# Patient Record
Sex: Male | Born: 1995 | Race: White | Hispanic: No | Marital: Single | Smoking: Former smoker
Health system: Southern US, Community
[De-identification: ages and names within clinical notes are randomized; demographics above are authoritative.]

## PROBLEM LIST (undated history)

## (undated) DIAGNOSIS — R569 Unspecified convulsions: Secondary | ICD-10-CM

## (undated) DIAGNOSIS — F909 Attention-deficit hyperactivity disorder, unspecified type: Secondary | ICD-10-CM

---
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
## 2013-02-06 ENCOUNTER — Emergency Department (INDEPENDENT_AMBULATORY_CARE_PROVIDER_SITE_OTHER)
Admission: EM | Admit: 2013-02-06 | Discharge: 2013-02-06 | Disposition: A | Discharge status: Home / Self Care | Payer: Self-pay | Source: Home / Self Care | Attending: Family Medicine | Admitting: Family Medicine

## 2013-02-06 ENCOUNTER — Encounter (HOSPITAL_COMMUNITY): Payer: Self-pay | Admitting: Emergency Medicine

## 2013-02-06 DIAGNOSIS — Z043 Encounter for examination and observation following other accident: Secondary | ICD-10-CM
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
## 2013-09-14 ENCOUNTER — Encounter (HOSPITAL_COMMUNITY): Payer: Self-pay | Admitting: *Deleted

## 2013-09-14 ENCOUNTER — Emergency Department (HOSPITAL_COMMUNITY)
Admission: EM | Admit: 2013-09-14 | Discharge: 2013-09-14 | Disposition: A | Discharge status: Home / Self Care | Payer: Medicaid Other | Attending: Emergency Medicine | Admitting: Emergency Medicine

## 2013-09-14 DIAGNOSIS — Z23 Encounter for immunization: Secondary | ICD-10-CM | POA: Insufficient documentation

## 2013-09-14 DIAGNOSIS — T7411XA Adult physical abuse, confirmed, initial encounter: Secondary | ICD-10-CM | POA: Insufficient documentation

## 2013-09-14 DIAGNOSIS — S0083XA Contusion of other part of head, initial encounter: Secondary | ICD-10-CM

## 2013-09-14 DIAGNOSIS — S0003XA Contusion of scalp, initial encounter: Secondary | ICD-10-CM | POA: Insufficient documentation

## 2013-09-14 DIAGNOSIS — Z79899 Other long term (current) drug therapy: Secondary | ICD-10-CM | POA: Insufficient documentation

## 2013-09-14 DIAGNOSIS — F909 Attention-deficit hyperactivity disorder, unspecified type: Secondary | ICD-10-CM | POA: Insufficient documentation

## 2013-09-14 DIAGNOSIS — G40909 Epilepsy, unspecified, not intractable, without status epilepticus: Secondary | ICD-10-CM | POA: Insufficient documentation

## 2013-09-14 MED ORDER — TETANUS-DIPHTH-ACELL PERTUSSIS 5-2.5-18.5 LF-MCG/0.5 IM SUSP
0.5000 mL | Freq: Once | INTRAMUSCULAR | Status: AC
  Administered 2013-09-14: 0.5 mL via INTRAMUSCULAR
  Filled 2013-09-14: qty 0.5
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
## 2013-09-17 NOTE — ED Provider Notes (Signed)
Medical screening examination/treatment/procedure(s) were performed by non-physician practitioner and as supervising physician I was immediately available for consultation/collaboration.  Jaxyn Mestas T Stefanee Mckell, MD 09/17/13 0710 

## 2013-11-01 ENCOUNTER — Emergency Department (HOSPITAL_COMMUNITY)
Admission: EM | Admit: 2013-11-01 | Discharge: 2013-11-01 | Disposition: A | Discharge status: Home / Self Care | Payer: Medicaid Other | Attending: Emergency Medicine | Admitting: Emergency Medicine

## 2013-11-01 ENCOUNTER — Encounter (HOSPITAL_COMMUNITY): Payer: Self-pay | Admitting: Emergency Medicine

## 2013-11-01 DIAGNOSIS — Z79899 Other long term (current) drug therapy: Secondary | ICD-10-CM | POA: Insufficient documentation

## 2013-11-01 DIAGNOSIS — R569 Unspecified convulsions: Secondary | ICD-10-CM | POA: Insufficient documentation

## 2013-11-01 DIAGNOSIS — L259 Unspecified contact dermatitis, unspecified cause: Secondary | ICD-10-CM | POA: Insufficient documentation

## 2013-11-01 DIAGNOSIS — F909 Attention-deficit hyperactivity disorder, unspecified type: Secondary | ICD-10-CM | POA: Insufficient documentation

## 2013-11-01 NOTE — ED Notes (Signed)
Pt . reports skin rash / irritation with itching at bilateral axilla for several days .

## 2013-11-01 NOTE — ED Provider Notes (Signed)
CSN: [id number]     Arrival date & time 11/01/13  2228 History  This chart was scribed for non-physician practitioner, Wynetta Emery, PA-C working with Ethelda Chick, MD by Greggory Stallion, ED scribe. This patient was seen in room TR06C/TR06C and the patient's care was started at 11:12 PM.   Chief Complaint  Patient presents with  . Rash   The history is provided by the patient. No language interpreter was used.   HPI Comments: [name] is a 17 y.o. male who presents to the Emergency Department complaining of gradual onset, worsening itchy rash under both axilla that started several days ago. He states it started after he bought a new deodorant. Pt states he shaved his axillas today. Pt states the rash was sore but it is not anymore. He has used hydrocortisone cream with no relief.   Past Medical History  Diagnosis Date  . Seizure   . Adult ADHD    History reviewed. No pertinent past surgical history. No family history on file. History  Substance Use Topics  . Smoking status: Never Smoker   . Smokeless tobacco: Not on file  . Alcohol Use: No    Review of Systems A complete 10 system review of systems was obtained and all systems are negative except as noted in the HPI and PMH.   Allergies  Review of patient's allergies indicates no known allergies.  Home Medications   Current Outpatient Rx  Name  Route  Sig  Dispense  Refill  . cloNIDine (CATAPRES) 0.1 MG tablet   Oral   Take 0.1 mg by mouth 3 (three) times daily.          BP 142/92  Pulse 95  Temp(Src) 97.8 F (36.6 C) (Oral)  Resp 16  Ht 5\' 8"  (1.727 m)  Wt 149 lb 2 oz (67.643 kg)  BMI 22.68 kg/m2  SpO2 97%  Physical Exam  Nursing note and vitals reviewed. Constitutional: He is oriented to person, place, and time. He appears well-developed and well-nourished. No distress.  HENT:  Head: Normocephalic.  Eyes: Conjunctivae and EOM are normal.  Cardiovascular: Normal rate.   Pulmonary/Chest: Effort  normal. No stridor.  Musculoskeletal: Normal range of motion.  Neurological: He is alert and oriented to person, place, and time.  Skin:  Erythematous, clearly demarcated rash to bilateral axilla. There is no warmth, discharge; minimal tenderness to palpation.  Psychiatric: He has a normal mood and affect.    ED Course  Procedures (including critical care time)  DIAGNOSTIC STUDIES: Oxygen Saturation is 97% on RA, normal by my interpretation.    COORDINATION OF CARE: 11:19 PM-Discussed treatment plan which includes topical antibiotic and unscented soaps with pt at bedside and pt agreed to plan. Advised pt to return to the ED if it worsens.   Labs Review Labs Reviewed - No data to display Imaging Review No results found.  EKG Interpretation   None       MDM   1. Contact dermatitis     Filed Vitals:   11/01/13 2233 11/01/13 2331  BP: 142/92 135/82  Pulse: 95 92  Temp: 97.8 F (36.6 C)   TempSrc: Oral   Resp: 16 16  Height: 5\' 8"  (1.727 m)   Weight: 149 lb 2 oz (67.643 kg)   SpO2: 97% 100%     [name] is a 17 y.o. male with erythematous rash to bilateral axilla. Likely a contact dermatitis. Advised patient to DC all antiperspirants and this, wash gently with  baby shampoo. Advised him also to DC shaving her armpits.   Pt is hemodynamically stable, appropriate for, and amenable to discharge at this time. Pt verbalized understanding and agrees with care plan. All questions answered. Outpatient follow-up and specific return precautions discussed.    Note: Portions of this report may have been transcribed using voice recognition software. Every effort was made to ensure accuracy; however, inadvertent computerized transcription errors may be present    Wynetta Emery, PA-C 11/06/13 [phone]

## 2013-11-10 NOTE — ED Provider Notes (Signed)
Medical screening examination/treatment/procedure(s) were performed by non-physician practitioner and as supervising physician I was immediately available for consultation/collaboration.  EKG Interpretation   None        Martha K Linker, MD 11/10/13 1508 

## 2013-11-15 ENCOUNTER — Emergency Department (HOSPITAL_COMMUNITY)
Admission: EM | Admit: 2013-11-15 | Discharge: 2013-11-15 | Disposition: A | Discharge status: Home / Self Care | Payer: Medicaid Other | Attending: Emergency Medicine | Admitting: Emergency Medicine

## 2013-11-15 ENCOUNTER — Encounter (HOSPITAL_COMMUNITY): Payer: Self-pay | Admitting: Emergency Medicine

## 2013-11-15 ENCOUNTER — Emergency Department (HOSPITAL_COMMUNITY): Payer: Medicaid Other

## 2013-11-15 DIAGNOSIS — R05 Cough: Secondary | ICD-10-CM

## 2013-11-15 DIAGNOSIS — Z79899 Other long term (current) drug therapy: Secondary | ICD-10-CM | POA: Insufficient documentation

## 2013-11-15 DIAGNOSIS — H6690 Otitis media, unspecified, unspecified ear: Secondary | ICD-10-CM | POA: Insufficient documentation

## 2013-11-15 DIAGNOSIS — J069 Acute upper respiratory infection, unspecified: Secondary | ICD-10-CM

## 2013-11-15 DIAGNOSIS — R000 Tachycardia, unspecified: Secondary | ICD-10-CM | POA: Insufficient documentation

## 2013-11-15 DIAGNOSIS — F909 Attention-deficit hyperactivity disorder, unspecified type: Secondary | ICD-10-CM | POA: Insufficient documentation

## 2013-11-15 DIAGNOSIS — G40909 Epilepsy, unspecified, not intractable, without status epilepticus: Secondary | ICD-10-CM | POA: Insufficient documentation

## 2013-11-15 DIAGNOSIS — R1110 Vomiting, unspecified: Secondary | ICD-10-CM | POA: Insufficient documentation

## 2013-11-15 MED ORDER — HYDROCODONE-HOMATROPINE 5-1.5 MG/5ML PO SYRP: 5.0000 mL | ORAL_SOLUTION | Freq: Four times a day (QID) | ORAL | Status: DC | PRN

## 2013-11-15 NOTE — ED Notes (Addendum)
Pt has had ear infection and cough x's 2 weeks.  Pt is currently on antibiotics for right ear infection but continue to have pain. Pt's mother st's he coughs until he vomits.

## 2013-11-15 NOTE — ED Provider Notes (Signed)
CSN: [id number]     Arrival date & time 11/15/13  2129 History  This chart was scribed for non-physician practitioner, Dierdre Forth, PA-C working with Olivia Mackie, MD by Greggory Stallion, ED scribe. This patient was seen in room TR08C/TR08C and the patient's care was started at 11:32 PM.   Chief Complaint  Patient presents with  . Cough   The history is provided by the patient. No language interpreter was used.   HPI Comments: [name] is a 17 y.o. male who presents to the Emergency Department complaining of a right ear infection and cough that started 2 weeks ago. He is currently on antibiotics for his ear infection and has ear drops but continues to have pain. Pt has coughing episodes that cause emesis. Denies chest pain. Denies fever, chills, headache, neck pain, neck stiffness, rash, weakness, dizziness, abdominal pain, diarrhea, syncope, dysuria.  Nothing seems to make the cough any better and is much worse at night.  Past Medical History  Diagnosis Date  . Seizure   . Adult ADHD    History reviewed. No pertinent past surgical history. History reviewed. No pertinent family history. History  Substance Use Topics  . Smoking status: Never Smoker   . Smokeless tobacco: Not on file  . Alcohol Use: No    Review of Systems  Constitutional: Negative for fever, diaphoresis, appetite change, fatigue and unexpected weight change.  HENT: Positive for ear pain. Negative for mouth sores.   Eyes: Negative for visual disturbance.  Respiratory: Positive for cough. Negative for chest tightness, shortness of breath and wheezing.   Cardiovascular: Negative for chest pain.  Gastrointestinal: Negative for nausea, vomiting, abdominal pain, diarrhea and constipation.  Endocrine: Negative for polydipsia, polyphagia and polyuria.  Genitourinary: Negative for dysuria, urgency, frequency and hematuria.  Musculoskeletal: Negative for back pain and neck stiffness.  Skin: Negative for rash.   Allergic/Immunologic: Negative for immunocompromised state.  Neurological: Negative for syncope, light-headedness and headaches.  Hematological: Does not bruise/bleed easily.  Psychiatric/Behavioral: Negative for sleep disturbance. The patient is not nervous/anxious.     Allergies  Review of patient's allergies indicates no known allergies.  Home Medications   Current Outpatient Rx  Name  Route  Sig  Dispense  Refill  . amoxicillin (AMOXIL) 500 MG tablet   Oral   Take 500 mg by mouth 2 (two) times daily. Started 11/12/13, for 10 days, ending 11/21/13         . antipyrine-benzocaine (AURALGAN) otic solution   Both Ears   Place 3-4 drops into both ears every 2 (two) hours as needed for ear pain.         . cloNIDine (CATAPRES) 0.1 MG tablet   Oral   Take 0.1 mg by mouth 3 (three) times daily.         . promethazine (PHENERGAN) 12.5 MG tablet   Oral   Take 12.5 mg by mouth once.           BP 139/85  Pulse 104  Temp(Src) 98.1 F (36.7 C) (Oral)  Ht 5\' 7"  (1.702 m)  Wt 146 lb 4 oz (66.339 kg)  BMI 22.90 kg/m2  SpO2 99%  Physical Exam  Nursing note and vitals reviewed. Constitutional: He is oriented to person, place, and time. He appears well-developed and well-nourished. No distress.  HENT:  Head: Normocephalic and atraumatic.  Right Ear: External ear and ear canal normal. Tympanic membrane is erythematous and bulging.  Left Ear: External ear and ear canal normal. Tympanic membrane  is erythematous and bulging.  Nose: Mucosal edema and rhinorrhea present. No epistaxis. Right sinus exhibits no maxillary sinus tenderness and no frontal sinus tenderness. Left sinus exhibits no maxillary sinus tenderness and no frontal sinus tenderness.  Mouth/Throat: Uvula is midline and mucous membranes are normal. Mucous membranes are not pale and not cyanotic. Posterior oropharyngeal erythema (mild) present. No oropharyngeal exudate, posterior oropharyngeal edema or tonsillar  abscesses.  Eyes: Conjunctivae are normal. Pupils are equal, round, and reactive to light.  Neck: Normal range of motion and full passive range of motion without pain.  Cardiovascular: Regular rhythm, normal heart sounds and intact distal pulses.  Tachycardia present.   No murmur heard. Pulmonary/Chest: Effort normal and breath sounds normal. No stridor. No respiratory distress. He has no wheezes. He has no rhonchi. He has no rales.  Abdominal: Soft. Bowel sounds are normal. There is no tenderness.  Musculoskeletal: Normal range of motion.  Lymphadenopathy:    He has no cervical adenopathy.  Neurological: He is alert and oriented to person, place, and time.  Skin: Skin is dry. No rash noted. He is not diaphoretic.  Psychiatric: He has a normal mood and affect.    ED Course  Procedures (including critical care time)  DIAGNOSTIC STUDIES: Oxygen Saturation is 99% on RA, normal by my interpretation.    COORDINATION OF CARE: 11:34 PM-Discussed treatment plan which includes chest xray and cough syrup with pt at bedside and pt agreed to plan.   Labs Review Labs Reviewed - No data to display Imaging Review Dg Chest 2 View  11/15/2013   CLINICAL DATA:  Cough for 3 days  EXAM: CHEST  2 VIEW  COMPARISON:  None.  FINDINGS: The heart size and mediastinal contours are within normal limits. Both lungs are clear. The visualized skeletal structures are unremarkable.  IMPRESSION: No active cardiopulmonary disease.   Electronically Signed   By: Esperanza Heir M.D.   On: 11/15/2013 23:41    EKG Interpretation   None       MDM   1. Viral URI with cough   2. Cough    Robby Sermon symptoms of viral URI already taking antibiotics for otitis media and bacterial pharyngitis.  Mother reports posttussive emesis because of cough and request cough medication.  Pt CXR negative for acute infiltrate. I personally reviewed the imaging tests through PACS system.  I reviewed available ER/hospitalization  records through the EMR.   Pt will be discharged with symptomatic treatment.  Verbalizes understanding and is agreeable with plan. Pt with mild tachycardia on exam but is otherwise hemodynamically stable & in NAD prior to dc.  It has been determined that no acute conditions requiring further emergency intervention are present at this time. The patient/guardian have been advised of the diagnosis and plan. We have discussed signs and symptoms that warrant return to the ED, such as changes or worsening in symptoms.   Patient/guardian has voiced understanding and agreed to follow-up with the PCP or specialist.  I personally performed the services described in this documentation, which was scribed in my presence. The recorded information has been reviewed and is accurate.    Dahlia Client Elvan Ebron, PA-C 11/15/13 [phone]

## 2013-11-16 NOTE — ED Provider Notes (Signed)
Medical screening examination/treatment/procedure(s) were performed by non-physician practitioner and as supervising physician I was immediately available for consultation/collaboration.    Olivia Mackie, MD 11/16/13 [phone]

## 2013-11-29 ENCOUNTER — Other Ambulatory Visit: Payer: Self-pay | Admitting: Family Medicine

## 2014-03-26 ENCOUNTER — Encounter (HOSPITAL_COMMUNITY): Payer: Self-pay | Admitting: Emergency Medicine

## 2014-03-26 ENCOUNTER — Emergency Department (INDEPENDENT_AMBULATORY_CARE_PROVIDER_SITE_OTHER): Payer: Medicaid Other

## 2014-03-26 ENCOUNTER — Emergency Department (INDEPENDENT_AMBULATORY_CARE_PROVIDER_SITE_OTHER)
Admission: EM | Admit: 2014-03-26 | Discharge: 2014-03-26 | Disposition: A | Discharge status: Home / Self Care | Payer: Medicaid Other | Source: Home / Self Care | Attending: Family Medicine | Admitting: Family Medicine

## 2014-03-26 DIAGNOSIS — W268XXA Contact with other sharp object(s), not elsewhere classified, initial encounter: Secondary | ICD-10-CM

## 2014-03-26 DIAGNOSIS — S61409A Unspecified open wound of unspecified hand, initial encounter: Secondary | ICD-10-CM

## 2014-03-26 DIAGNOSIS — S61419A Laceration without foreign body of unspecified hand, initial encounter: Secondary | ICD-10-CM

## 2014-03-26 MED ORDER — DOXYCYCLINE HYCLATE 100 MG PO CAPS: 100.0000 mg | ORAL_CAPSULE | Freq: Two times a day (BID) | ORAL | Status: AC

## 2014-03-26 MED ORDER — DICLOFENAC SODIUM 50 MG PO TBEC: 50.0000 mg | DELAYED_RELEASE_TABLET | Freq: Two times a day (BID) | ORAL | Status: AC | PRN

## 2014-03-26 MED ORDER — CEPHALEXIN 500 MG PO CAPS: 500.0000 mg | ORAL_CAPSULE | Freq: Four times a day (QID) | ORAL | Status: AC

## 2014-03-26 NOTE — Discharge Instructions (Signed)
Thank you for coming in today. Take Keflex 4 times daily and doxycycline twice daily to prevent infection.  Use diclofenac for severe pain. Do not take ibuprofen or Aleve.  Return if the wound becomes very red or painful or starts oozing pus. Keep the wound covered with antibiotic ointment.  Laceration Care, Adult A laceration is a cut or lesion that goes through all layers of the skin and into the tissue just beneath the skin. TREATMENT  Some lacerations may not require closure. Some lacerations may not be able to be closed due to an increased risk of infection. It is important to see your caregiver as soon as possible after an injury to minimize the risk of infection and maximize the opportunity for successful closure. If closure is appropriate, pain medicines may be given, if needed. The wound will be cleaned to help prevent infection. Your caregiver will use stitches (sutures), staples, wound glue (adhesive), or skin adhesive strips to repair the laceration. These tools bring the skin edges together to allow for faster healing and a better cosmetic outcome. However, all wounds will heal with a scar. Once the wound has healed, scarring can be minimized by covering the wound with sunscreen during the day for 1 full year. HOME CARE INSTRUCTIONS  For sutures or staples:  Keep the wound clean and dry.  If you were given a bandage (dressing), you should change it at least once a day. Also, change the dressing if it becomes wet or dirty, or as directed by your caregiver.  Wash the wound with soap and water 2 times a day. Rinse the wound off with water to remove all soap. Pat the wound dry with a clean towel.  After cleaning, apply a thin layer of the antibiotic ointment as recommended by your caregiver. This will help prevent infection and keep the dressing from sticking.  You may shower as usual after the first 24 hours. Do not soak the wound in water until the sutures are removed.  Only take  over-the-counter or prescription medicines for pain, discomfort, or fever as directed by your caregiver.  Get your sutures or staples removed as directed by your caregiver. For skin adhesive strips:  Keep the wound clean and dry.  Do not get the skin adhesive strips wet. You may bathe carefully, using caution to keep the wound dry.  If the wound gets wet, pat it dry with a clean towel.  Skin adhesive strips will fall off on their own. You may trim the strips as the wound heals. Do not remove skin adhesive strips that are still stuck to the wound. They will fall off in time. For wound adhesive:  You may briefly wet your wound in the shower or bath. Do not soak or scrub the wound. Do not swim. Avoid periods of heavy perspiration until the skin adhesive has fallen off on its own. After showering or bathing, gently pat the wound dry with a clean towel.  Do not apply liquid medicine, cream medicine, or ointment medicine to your wound while the skin adhesive is in place. This may loosen the film before your wound is healed.  If a dressing is placed over the wound, be careful not to apply tape directly over the skin adhesive. This may cause the adhesive to be pulled off before the wound is healed.  Avoid prolonged exposure to sunlight or tanning lamps while the skin adhesive is in place. Exposure to ultraviolet light in the first year will darken the scar.  The skin adhesive will usually remain in place for 5 to 10 days, then naturally fall off the skin. Do not pick at the adhesive film. You may need a tetanus shot if:  You cannot remember when you had your last tetanus shot.  You have never had a tetanus shot. If you get a tetanus shot, your arm may swell, get red, and feel warm to the touch. This is common and not a problem. If you need a tetanus shot and you choose not to have one, there is a rare chance of getting tetanus. Sickness from tetanus can be serious. SEEK MEDICAL CARE IF:   You  have redness, swelling, or increasing pain in the wound.  You see a red line that goes away from the wound.  You have yellowish-white fluid (pus) coming from the wound.  You have a fever.  You notice a bad smell coming from the wound or dressing.  Your wound breaks open before or after sutures have been removed.  You notice something coming out of the wound such as wood or glass.  Your wound is on your hand or foot and you cannot move a finger or toe. SEEK IMMEDIATE MEDICAL CARE IF:   Your pain is not controlled with prescribed medicine.  You have severe swelling around the wound causing pain and numbness or a change in color in your arm, hand, leg, or foot.  Your wound splits open and starts bleeding.  You have worsening numbness, weakness, or loss of function of any joint around or beyond the wound.  You develop painful lumps near the wound or on the skin anywhere on your body. MAKE SURE YOU:   Understand these instructions.  Will watch your condition.  Will get help right away if you are not doing well or get worse. Document Released: 12/02/2005 Document Revised: 02/24/2012 Document Reviewed: 05/28/2011 [facility] IncExitCare Patient Information 2014 [place], [place].

## 2014-03-26 NOTE — ED Provider Notes (Signed)
[name] is a 18 y.o. male who presents to Urgent Care today for puncture wound to the left hand. Patient was hit in the hand with a stick with the nail embedded in it today. This occurred when his brother and him were playing. He suffered a puncture wound to the interdigital webspace between the third and fourth digits of the left hand. He applied a dressing to stop the bleeding. He feels well but. He denies any radiating pain weakness or numbness fevers or chills. His last tetanus shot was within the last year or 2. He feels well otherwise.   Past Medical History  Diagnosis Date  . Seizure   . Adult ADHD    History  Substance Use Topics  . Smoking status: Former Games developer  . Smokeless tobacco: Not on file  . Alcohol Use: No   ROS as above Medications: No current facility-administered medications for this encounter.   Current Outpatient Prescriptions  Medication Sig Dispense Refill  . cloNIDine (CATAPRES) 0.1 MG tablet Take 0.1 mg by mouth 3 (three) times daily.      Marland Kitchen antipyrine-benzocaine (AURALGAN) otic solution Place 3-4 drops into both ears every 2 (two) hours as needed for ear pain.      . cephALEXin (KEFLEX) 500 MG capsule Take 1 capsule (500 mg total) by mouth 4 (four) times daily.  40 capsule  0  . diclofenac (VOLTAREN) 50 MG EC tablet Take 1 tablet (50 mg total) by mouth 2 (two) times daily as needed.  60 tablet  0  . doxycycline (VIBRAMYCIN) 100 MG capsule Take 1 capsule (100 mg total) by mouth 2 (two) times daily.  20 capsule  0    Exam:  BP 134/84  Pulse 76  Temp(Src) 98.4 F (36.9 C) (Oral)  Resp 17  SpO2 100% Gen: Well NAD Left hand: 1 cm laceration to the interdigital webspace of the left hand between digits 3 and 4. The laceration extends approximately 1 cm deep. No tenderness structures are noted.  Capillary refill sensation and pulses are intact distally.  Hand motion is intact.  Laceration care:  The laceration was copiously irrigated with a sterile  saline and Betadine solution. The laceration was carefully inspected revealing no foreign body.   No results found for this or any previous visit (from the past 24 hour(s)). Dg Hand Complete Left  03/26/2014   CLINICAL DATA:  Hand pain status post puncture wound in the space between the third and fourth digits  EXAM: LEFT HAND - COMPLETE 3+ VIEW  COMPARISON:  None.  FINDINGS: Three views of the left hand reveal the bones to be adequately mineralized. There is no evidence of a cortical injury. There is punctate radiodensity demonstrated on the AP view only that lies in the interspace between the bases of the third and fourth proximal phalanges. This may reflect a retained foreign body. No soft tissue gas collections are demonstrated.  IMPRESSION: There is no acute bony abnormality of the left hand. A 1 mm diameter or less radiodensity in the web between the bases of the third and fourth digits may reflect a foreign body.   Electronically Signed   By: David  [place]   On: 03/26/2014 13:34    Assessment and Plan: 18 y.o. male with laceration to the left hand. Given the dirty nature of the wound and possible foreign body I think that is healing by secondary intent is safer. The wound is small and in a low skin tension area.  We'll watch  carefully while treating with both Keflex and doxycycline antibiotic. His tetanus is up-to-date.  Discussed warning signs or symptoms. Please see discharge instructions. Patient expresses understanding.    [name], MD 03/26/14 1430

## 2014-03-26 NOTE — ED Notes (Signed)
Today about 1200 today pt's left hand was hit by a board with a nail.  Laceration web space between middle and ring finger  No active bleeding.    Last tetanus was 1-2 years ago

## 2014-07-16 IMAGING — CR DG HAND COMPLETE 3+V*L*
3 series · 3 of 3 positions shown · non-contrast
Comparison: None.

CLINICAL DATA: Hand pain status post puncture wound in the space
between the third and fourth digits

EXAM:
LEFT HAND - COMPLETE 3+ VIEW

[view not recorded (1 of 3)]
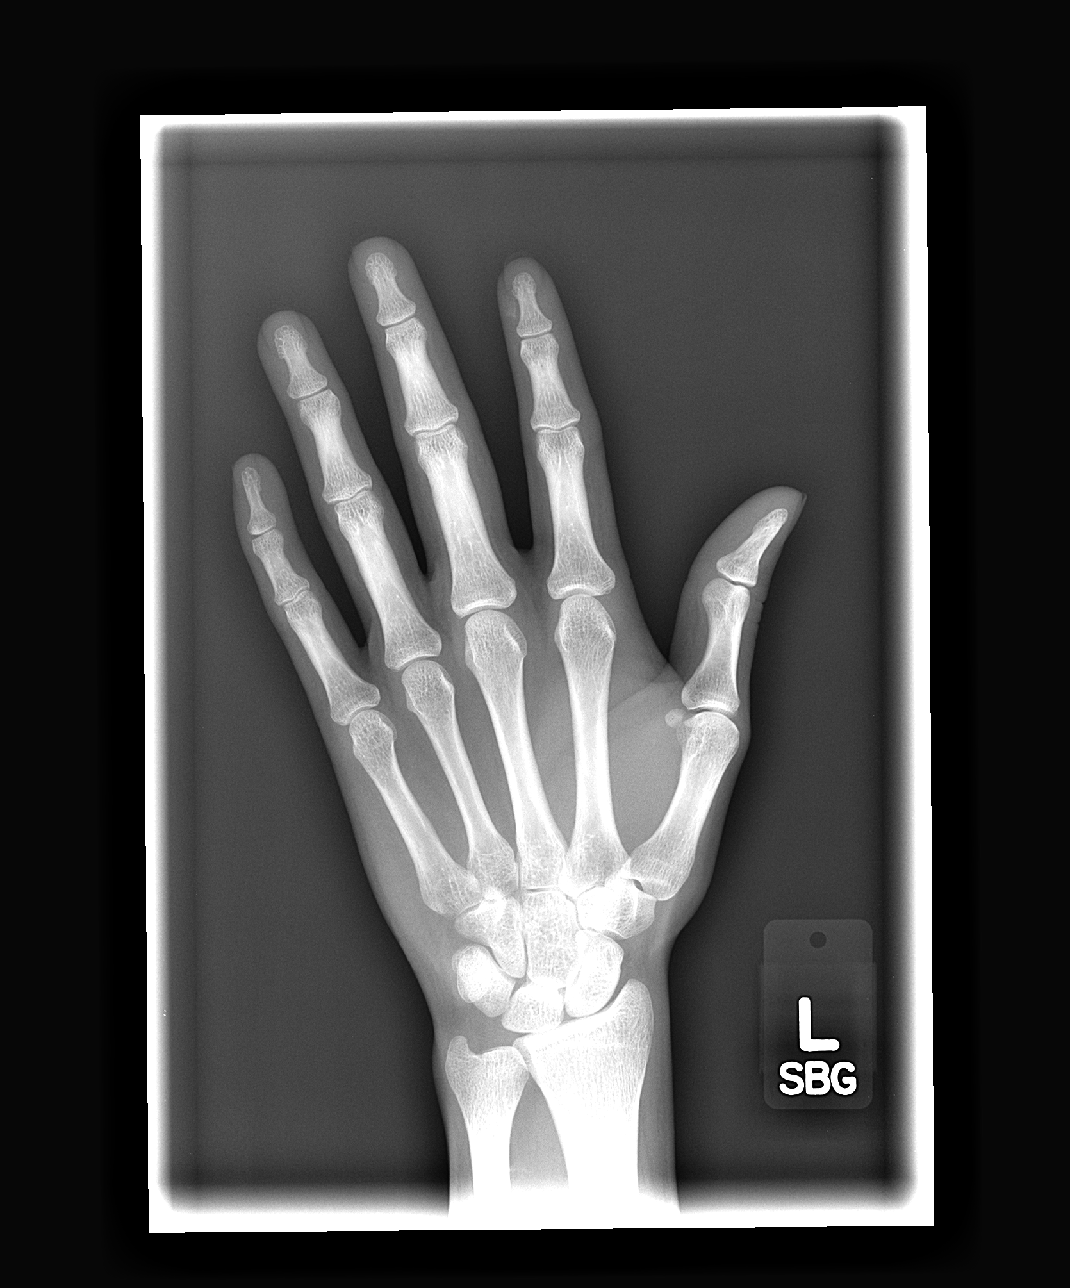

[view not recorded (2 of 3)]
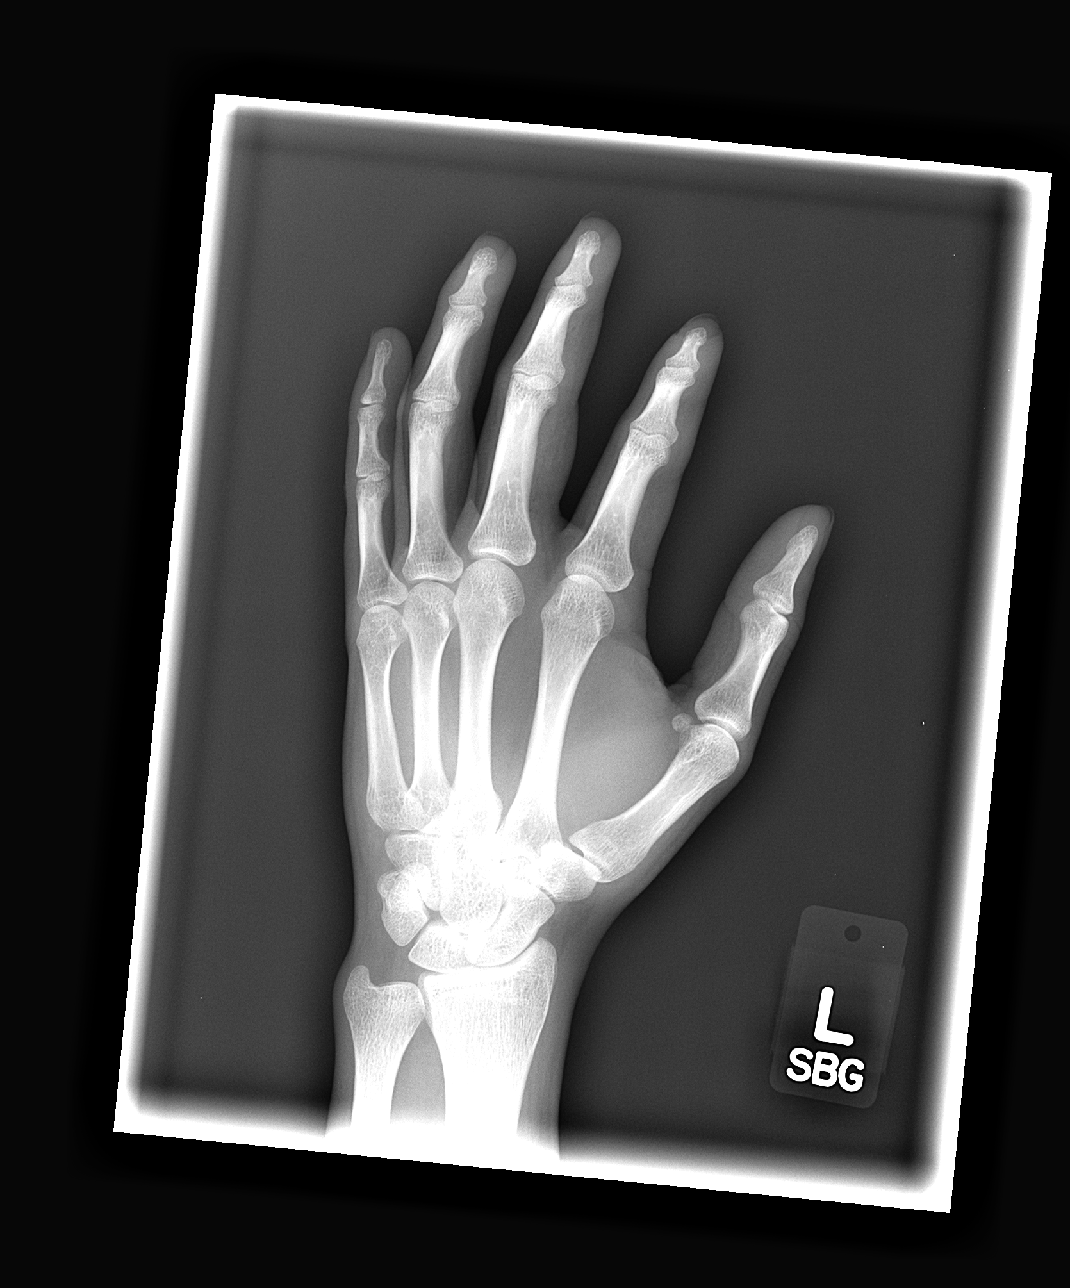

[view not recorded (3 of 3)]
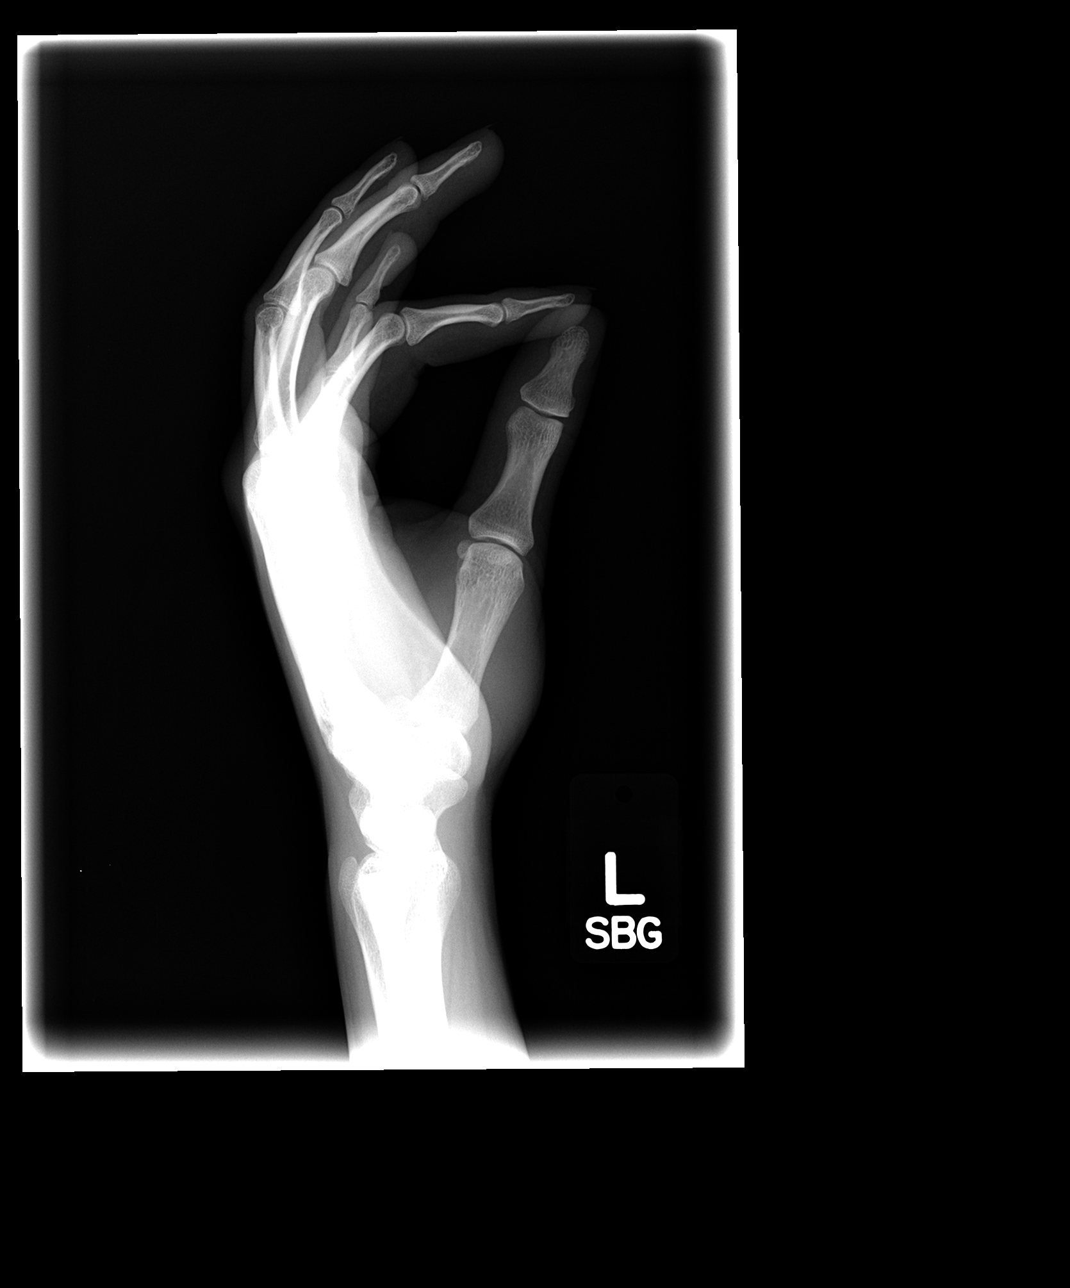

[3 of 3 positions shown; findings below may reference images not displayed]

FINDINGS: Three views of the left hand reveal the bones to be adequately
mineralized. There is no evidence of a cortical injury. There is
punctate radiodensity demonstrated on the AP view only that lies in
the interspace between the bases of the third and fourth proximal
phalanges. This may reflect a retained foreign body. No soft tissue
gas collections are demonstrated.
IMPRESSION: There is no acute bony abnormality of the left hand. A 1 mm diameter
or less radiodensity in the web between the bases of the third and
fourth digits may reflect a foreign body.

## 2014-12-25 ENCOUNTER — Encounter (HOSPITAL_COMMUNITY): Payer: Self-pay | Admitting: Emergency Medicine

## 2014-12-25 ENCOUNTER — Emergency Department (HOSPITAL_COMMUNITY): Payer: Medicaid Other

## 2014-12-25 ENCOUNTER — Emergency Department (HOSPITAL_COMMUNITY)
Admission: EM | Admit: 2014-12-25 | Discharge: 2014-12-25 | Disposition: A | Discharge status: Home / Self Care | Payer: Medicaid Other | Attending: Emergency Medicine | Admitting: Emergency Medicine

## 2014-12-25 DIAGNOSIS — R0602 Shortness of breath: Secondary | ICD-10-CM | POA: Diagnosis present

## 2014-12-25 DIAGNOSIS — Z791 Long term (current) use of non-steroidal anti-inflammatories (NSAID): Secondary | ICD-10-CM | POA: Insufficient documentation

## 2014-12-25 DIAGNOSIS — K219 Gastro-esophageal reflux disease without esophagitis: Secondary | ICD-10-CM | POA: Insufficient documentation

## 2014-12-25 DIAGNOSIS — Z792 Long term (current) use of antibiotics: Secondary | ICD-10-CM | POA: Diagnosis not present

## 2014-12-25 DIAGNOSIS — R0789 Other chest pain: Secondary | ICD-10-CM | POA: Diagnosis not present

## 2014-12-25 DIAGNOSIS — R079 Chest pain, unspecified: Secondary | ICD-10-CM

## 2014-12-25 DIAGNOSIS — Z87891 Personal history of nicotine dependence: Secondary | ICD-10-CM | POA: Insufficient documentation

## 2014-12-25 DIAGNOSIS — Z8659 Personal history of other mental and behavioral disorders: Secondary | ICD-10-CM | POA: Insufficient documentation

## 2014-12-25 LAB — COMPREHENSIVE METABOLIC PANEL
ALT: 23 U/L (ref 0–53)
AST: 40 U/L — ABNORMAL HIGH (ref 0–37)
Albumin: 3.9 g/dL (ref 3.5–5.2)
Alkaline Phosphatase: 64 U/L (ref 39–117)
Anion gap: 7 (ref 5–15)
BUN: 11 mg/dL (ref 6–23)
CHLORIDE: 103 meq/L (ref 96–112)
CO2: 28 mmol/L (ref 19–32)
CREATININE: 0.93 mg/dL (ref 0.50–1.35)
Calcium: 9.2 mg/dL (ref 8.4–10.5)
GFR calc Af Amer: >90 mL/min (ref >90)
GFR calc non Af Amer: >90 mL/min (ref >90)
Glucose, Bld: 107 mg/dL — ABNORMAL HIGH (ref 70–99)
POTASSIUM: 3.6 mmol/L (ref 3.5–5.1)
SODIUM: 138 mmol/L (ref 135–145)
Total Bilirubin: 0.4 mg/dL (ref 0.3–1.2)
Total Protein: 6.9 g/dL (ref 6.0–8.3)

## 2014-12-25 LAB — URINALYSIS, ROUTINE W REFLEX MICROSCOPIC
Bilirubin Urine: NEGATIVE
Glucose, UA: NEGATIVE mg/dL
Hgb urine dipstick: NEGATIVE
KETONES UR: 15 mg/dL — AB
Leukocytes, UA: NEGATIVE
NITRITE: NEGATIVE
PH: 6.5 (ref 5.0–8.0)
Protein, ur: 30 mg/dL — AB
Specific Gravity, Urine: 1.041 — ABNORMAL HIGH (ref 1.005–1.030)
UROBILINOGEN UA: 1 mg/dL (ref 0.0–1.0)

## 2014-12-25 LAB — CBC WITH DIFFERENTIAL/PLATELET
BASOS ABS: 0 10*3/uL (ref 0.0–0.1)
BASOS PCT: 0 % (ref 0–1)
EOS PCT: 4 % (ref 0–5)
Eosinophils Absolute: 0.2 10*3/uL (ref 0.0–0.7)
HEMATOCRIT: 37.7 % — AB (ref 39.0–52.0)
HEMOGLOBIN: 13.2 g/dL (ref 13.0–17.0)
LYMPHS ABS: 1.7 10*3/uL (ref 0.7–4.0)
Lymphocytes Relative: 33 % (ref 12–46)
MCH: 32 pg (ref 26.0–34.0)
MCHC: 35 g/dL (ref 30.0–36.0)
MCV: 91.5 fL (ref 78.0–100.0)
Monocytes Absolute: 0.7 10*3/uL (ref 0.1–1.0)
Monocytes Relative: 13 % — ABNORMAL HIGH (ref 3–12)
Neutro Abs: 2.6 10*3/uL (ref 1.7–7.7)
Neutrophils Relative %: 50 % (ref 43–77)
Platelets: 262 10*3/uL (ref 150–400)
RBC: 4.12 MIL/uL — AB (ref 4.22–5.81)
RDW: 12.3 % (ref 11.5–15.5)
WBC: 5.1 10*3/uL (ref 4.0–10.5)

## 2014-12-25 LAB — URINE MICROSCOPIC-ADD ON

## 2014-12-25 LAB — LIPASE, BLOOD: Lipase: 51 U/L (ref 11–59)

## 2014-12-25 MED ORDER — OMEPRAZOLE 20 MG PO CPDR: 20.0000 mg | DELAYED_RELEASE_CAPSULE | Freq: Every day | ORAL | Status: AC

## 2014-12-25 NOTE — ED Notes (Signed)
Patient here with complaint of shortness of breath which became worse tonight. Awoke tonight with difficulty breathing. Additionally reports emesis in the AM of green character with blood mixed in per family. Reports fatigue and possible fever at home.

## 2014-12-25 NOTE — ED Notes (Signed)
EDP at bedside and ambulating in hall with patient

## 2014-12-25 NOTE — ED Provider Notes (Signed)
CSN: [id number]     Arrival date & time 12/25/14  0151 History  This chart was scribed for [name] found by Murriel HopperAlec Bankhead, ED Scribe. This patient was seen in room D32C/D32C and the patient's care was started at 2:15 AM.    Chief Complaint  Patient presents with  . Shortness of Breath  . Emesis    HPI  HPI Comments: [name] is a 19 y.o. male who presents to the Emergency Department complaining of a sense of nausea and discomfort in his chest every morning for the past year that relieves with vomiting.  He states he vomits most every morning.  He has never seen anyone for follow-up regarding this.  He seems to have no issues during the day.  He does report some occasional episodes of shortness of breath.  These are transient.  They're not always exertional.  He presents tonight after an episode of waking up from sleep and feeling discomfort in his chest and feeling short of breath.  He went to his mother and stated he felt panicked.  His mother reports that he looked panicked as well.  He states his breathing improved once he "calmed himself down".  Patient denies chest pain or shortness of breath at this time.  No family history of early cardiac disease.  The patient is 19 years old.   Past Medical History  Diagnosis Date  . Seizure   . Adult ADHD    History reviewed. No pertinent past surgical history. History reviewed. No pertinent family history. History  Substance Use Topics  . Smoking status: Former Games developermoker  . Smokeless tobacco: Not on file  . Alcohol Use: No    Review of Systems  All other systems reviewed and are negative.     Allergies  Review of patient's allergies indicates no known allergies.  Home Medications   Prior to Admission medications   Medication Sig Start Date End Date Taking? Authorizing Provider  antipyrine-benzocaine Lyla Son(AURALGAN) otic solution Place 3-4 drops into both ears every 2 (two) hours as needed for ear pain.    Historical  Provider, MD  cephALEXin (KEFLEX) 500 MG capsule Take 1 capsule (500 mg total) by mouth 4 (four) times daily. 03/26/14   [name], MD  cloNIDine (CATAPRES) 0.1 MG tablet Take 0.1 mg by mouth 3 (three) times daily.    Historical Provider, MD  diclofenac (VOLTAREN) 50 MG EC tablet Take 1 tablet (50 mg total) by mouth 2 (two) times daily as needed. 03/26/14   [name], MD  doxycycline (VIBRAMYCIN) 100 MG capsule Take 1 capsule (100 mg total) by mouth 2 (two) times daily. 03/26/14   [name], MD   There were no vitals taken for this visit. Physical Exam  Constitutional: He is oriented to person, place, and time. He appears well-developed and well-nourished.  Non-toxic appearance. No distress.  HENT:  Head: Normocephalic and atraumatic.  Eyes: Conjunctivae, EOM and lids are normal. Pupils are equal, round, and reactive to light.  Neck: Normal range of motion. Neck supple. No tracheal deviation present. No thyroid mass present.  Cardiovascular: Normal rate, regular rhythm and normal heart sounds.  Exam reveals no gallop.   No murmur heard. Pulmonary/Chest: Effort normal and breath sounds normal. No stridor. No respiratory distress. He has no decreased breath sounds. He has no wheezes. He has no rhonchi. He has no rales.  Abdominal: Soft. Normal appearance and bowel sounds are normal. He exhibits no distension. There is no  tenderness. There is no rebound and no CVA tenderness.  Musculoskeletal: Normal range of motion. He exhibits no edema or tenderness.  Neurological: He is alert and oriented to person, place, and time. He has normal strength. No cranial nerve deficit or sensory deficit. GCS eye subscore is 4. GCS verbal subscore is 5. GCS motor subscore is 6.  Skin: Skin is warm and dry. No abrasion and no rash noted.  Psychiatric: He has a normal mood and affect. His speech is normal and behavior is normal.  Nursing note and vitals reviewed.   ED Course  Procedures (including critical  care time)  DIAGNOSTIC STUDIES:     COORDINATION OF CARE: 2:15 AM Discussed treatment plan with pt at bedside and pt agreed to plan.   Labs Review Labs Reviewed  CBC WITH DIFFERENTIAL - Abnormal; Notable for the following:    RBC 4.12 (*)    HCT 37.7 (*)    Monocytes Relative 13 (*)    All other components within normal limits  COMPREHENSIVE METABOLIC PANEL - Abnormal; Notable for the following:    Glucose, Bld 107 (*)    AST 40 (*)    All other components within normal limits  URINALYSIS, ROUTINE W REFLEX MICROSCOPIC - Abnormal; Notable for the following:    APPearance CLOUDY (*)    Specific Gravity, Urine 1.041 (*)    Ketones, ur 15 (*)    Protein, ur 30 (*)    All other components within normal limits  URINE MICROSCOPIC-ADD ON - Abnormal; Notable for the following:    Crystals CA OXALATE CRYSTALS (*)    All other components within normal limits  LIPASE, BLOOD    Imaging Review Dg Chest 2 View  12/25/2014   CLINICAL DATA:  Vomiting after eating with intermittent fevers and chills this week. Woke up tonight with sudden onset shortness of breath.  EXAM: CHEST  2 VIEW  COMPARISON:  11/15/2013  FINDINGS: Hyperinflation. The heart size and mediastinal contours are within normal limits. Both lungs are clear. The visualized skeletal structures are unremarkable.  IMPRESSION: No active cardiopulmonary disease.   Electronically Signed   By: Burman Nieves M.D.   On: 12/25/2014 03:04     EKG Interpretation None      MDM   Final diagnoses:  SOB (shortness of breath)    Patient's overall well-appearing.  I suspect this is GERD.  The patient also appears to be having panic attacks.  We ambulated around the emergency department if difficulty.  He was never winded.  He did not complain of shortness of breath.  His pulse ox on return to the room is normal.  Patient be referred to GI.  Patient be placed on a PPI.  Recommendation for primary care for follow-up.  I personally  performed the services described in this documentation, which was scribed in my presence. The recorded information has been reviewed and is accurate.      Lyanne Co, MD 12/26/14 [phone]

## 2014-12-25 NOTE — ED Notes (Signed)
Pt arrives with sister and mom stating that he's been vomiting for over a year and awaking with shortness of breath and chest tightness for two weeks. Family answering questions for patient, patient not speaking much. Patient states he woke up "feeling like he was dying" d/t SHOB. No ABD pain, no N/V/D. Pt resting comfortably upon assessment, speaking in full sentences.

## 2015-04-16 IMAGING — CR DG CHEST 2V
2 series · 2 of 2 positions shown · non-contrast
Comparison: 11/15/2013

CLINICAL DATA: Vomiting after eating with intermittent fevers and
chills this week. Woke up tonight with sudden onset shortness of
breath.

EXAM:
CHEST  2 VIEW

[chest pa]
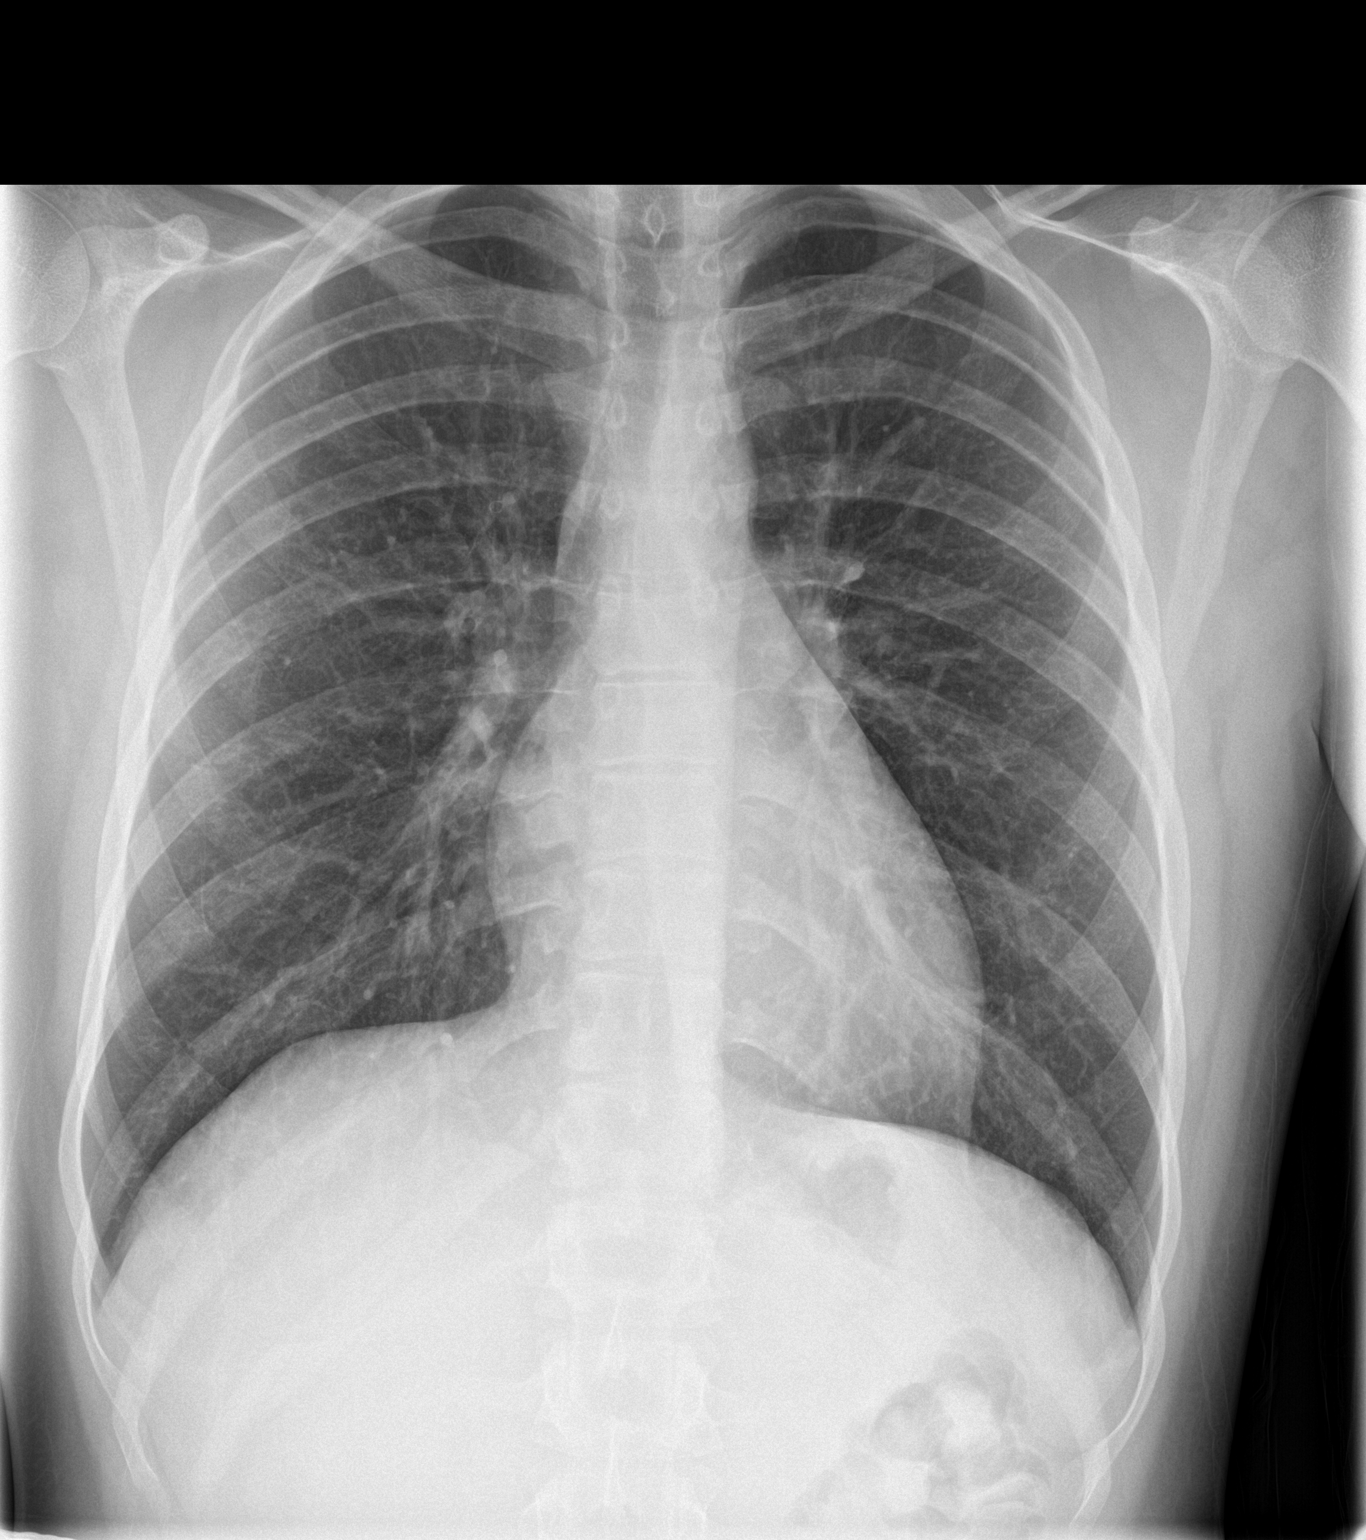

[chest lat]
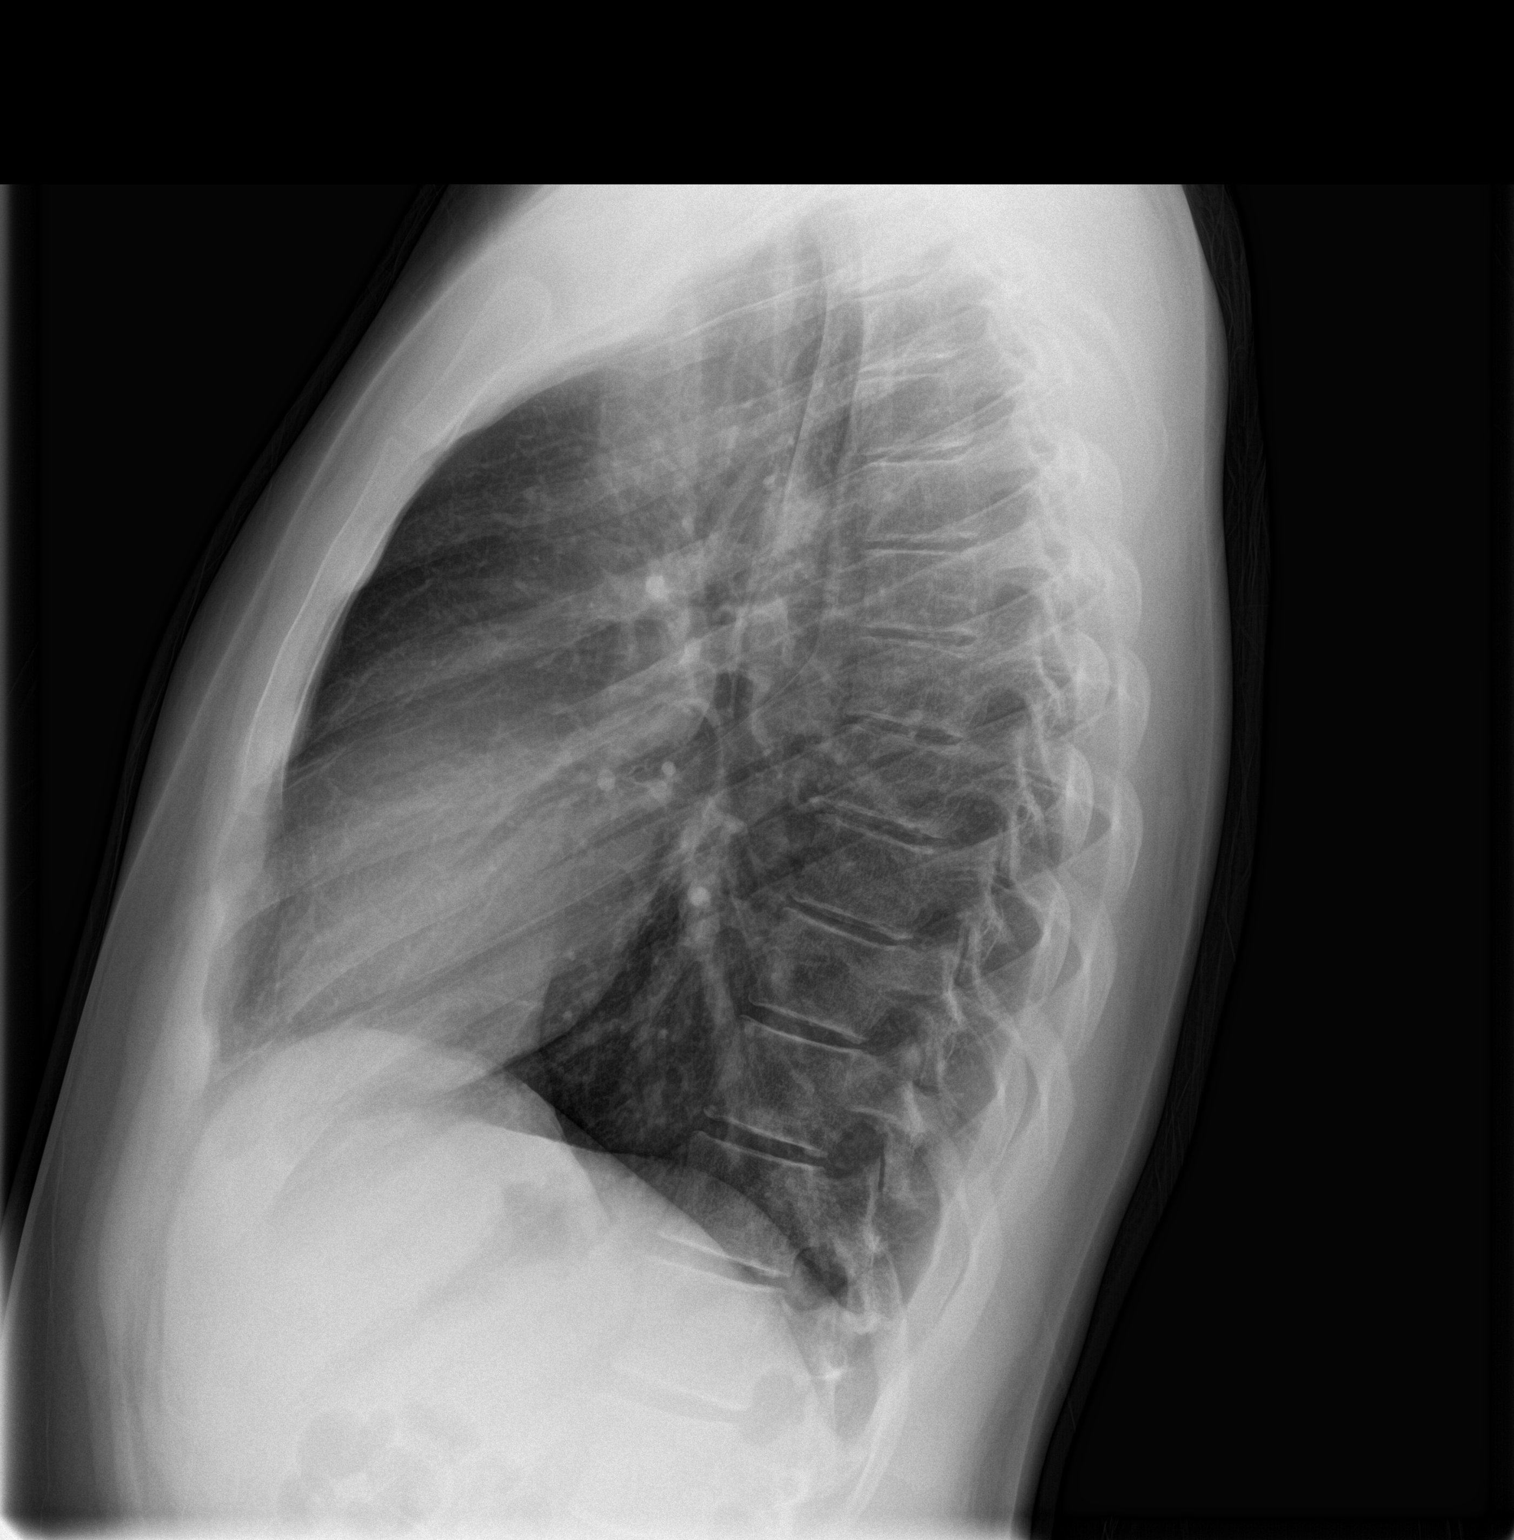

[2 of 2 positions shown; findings below may reference images not displayed]

FINDINGS: Hyperinflation. The heart size and mediastinal contours are within
normal limits. Both lungs are clear. The visualized skeletal
structures are unremarkable.
IMPRESSION: No active cardiopulmonary disease.

## 2018-11-16 ENCOUNTER — Ambulatory Visit (HOSPITAL_COMMUNITY): Payer: Self-pay | Admitting: Psychiatry

## 2018-11-26 ENCOUNTER — Ambulatory Visit (HOSPITAL_COMMUNITY): Payer: Medicaid Other | Admitting: Psychiatry

## 2018-12-03 ENCOUNTER — Ambulatory Visit (HOSPITAL_COMMUNITY): Payer: Medicaid Other | Admitting: Psychiatry

## 2018-12-29 ENCOUNTER — Ambulatory Visit (HOSPITAL_COMMUNITY): Payer: Medicaid Other | Admitting: Psychiatry
